# Patient Record
Sex: Male | Born: 1990 | Race: White | Hispanic: No | Marital: Single | Smoking: Current every day smoker
Health system: Southern US, Community
[De-identification: ages and names within clinical notes are randomized; demographics above are authoritative.]

## PROBLEM LIST (undated history)

## (undated) DIAGNOSIS — Z87730 Personal history of (corrected) cleft lip and palate: Secondary | ICD-10-CM

## (undated) HISTORY — PX: CLEFT PALATE REPAIR: SUR1165

## (undated) HISTORY — PX: CLEFT LIP REPAIR: SUR1164

---
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
## 2011-01-29 NOTE — Consult Note (Signed)
[place]  Patient:    [name], [name]                    MRN: [id number] Proc. Date: 12/14/99 Attending:  Janet Berlin. Dan Humphreys, M.D. CC:         Dora Sims, M.D.                          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  These defects have gone on to be repaired.  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  He has done very well.  He was last seen n a clinic here one year ago.  PLASTIC SURGERY:  The patient is noted to be doing very well overall.  He still  continues to have some slump of the left nasal ala; there is also a small ______ deformity on the left upper lip.  His pharyngeal flap is noted to be intact. He has a palatal expander in place.  There is a fairly large alveolar gap.  SPEECH PATHOLOGY:  The mother reports slightly increase in hypernasality with the palatal expander; however, the speech is found to be completely intelligible and oronasal resonance is felt to be within normal limits.  No recommendations were  made.  ORAL SURGERY:  The patients arch is noted to be fairly stable.  There does seem to be a little bit of persistent narrowing transversely.  The patient is felt to need some radiographs in order to more accurately gauge the development of the teeth of the arch in the immediate vicinity of the cleft; this will be set up through the office of Dr. Dora Sims.  A final decision as to timing of the bone graft  will not be made until more accurate evaluation is completed in regards to his ony development.  FINAL RECOMMENDATIONS:  Most of the patients issues at this point will revolve around management of his alveolar cleft.  Since he has no other pressing issues  currently, and is not anticipated to have any, given how well he is doing, we will discharge him from  further followup in the [facility].  He will be seen by appropriate specialists as the need arises.  In late adolescence, he should be evaluated by plastic surgery in order to determine the timing of any intervention in regards to lip repair as well as repair of his nasal deformity.  These issues will probably be best put off until he reaches skeletal maturity. DD:  12/14/99 TD:  12/14/99 Job: [id number]

## 2011-01-29 NOTE — Op Note (Signed)
NAME:  Jesse Howard, Jesse Howard                        ACCOUNT NO.:  [id number]   MEDICAL RECORD NO.:  [id number]                   PATIENT TYPE:  AMB   LOCATION:  DSC                                  FACILITY:  [facility]   PHYSICIAN:  Dora Sims, M.D.               DATE OF BIRTH:  [birth year]   DATE OF PROCEDURE:  08/27/2002  DATE OF DISCHARGE:                                 OPERATIVE REPORT   PREOPERATIVE DIAGNOSIS:  Left alveolar cleft palate.   POSTOPERATIVE DIAGNOSIS:  Left alveolar cleft palate.   OPERATION PERFORMED:  Harvest of left anterior iliac crest bone graft for  placement to left alveolar cleft.   SURGEON:  Dora Sims, M.D.   ASSISTANT:  Loni Beckwith   ANESTHESIA:  General endotracheal.   INDICATIONS FOR PROCEDURE:  The patient is a 20 year old boy whose  orthodontist is Corliss Parish.  He is in a mixed dentition phase.  He has been  treated for cleft lip and he has a cleft palate defect.  Teeth numbers 9 and  10 are evident radiographically and he is in need of his bone graft prior to  eruption of these teeth.  Appropriate consents were reviewed with the  parents and the patient.  The patient was then consented for surgery.   DESCRIPTION OF PROCEDURE:  The patient was maintained n.p.o. the night  before surgery, brought to the operating room and placed in supine position.  All anesthesia monitors were found to be working appropriately.  The patient  was orotracheally intubated with an oral ray tube with minimal difficulty.  This was confirmed by positive end tidal CO2 as well as bilaterally clear  breath sounds.  Prior to intubation under just gas mash anesthetic, an IV  was started in the left antecubital fossa at which time 60 cc of whole blood  was drawn for centrifuge for use as platelet rich plasma.  The patient was  then appropriately padded and after intubation, the patient's left iliac  crest area was prepped and draped in the normal sterile fashion.  Simultaneously, Dr. Dorcas Carrow was working at the head and the oral cavity  and mouth area was prepped and draped in the normal sterile fashion.  Two  fields were draped apart so as to not cross contaminate the oral field to  the hip field.  An Ioban drape was placed over the left anterior iliac crest  after using a marking pen to demarcate the anterior iliac spine along with  the crest.  There was retraction medially so as to have the incision line be  inferior to the iliac crest at rest.  He was injected with approximately 7  cc of 2% Xylocaine with 1:100,000 parts epinephrine.  After adequate time  for vasoconstriction was allowed, a #15 Bard Parker scalpel was used to make  skin incision down to the subcutaneous layer of fat.  Bovie electrocautery  unit was used to coagulate any small bleeding vessels.  Dissection was  continued down with the Bovie through fascia to the cartilaginous cap of the  anterior iliac crest.  A periosteal elevator was used to demarcate the  anterior and posterior limits of the cartilaginous cap.  A #10 blade was  used to incise through perichondrium down to the cartilaginous osseous  junction.  The horizontal cut was then used to bridge the two vertical  anterior posterior cuts and the cartilaginous cap was elevated and reflected  medially.  A cobra retractor was used after periosteum was elevated to  expose the marrow space of the anterior iliac crest.  Approximately 6 cc of  cancellous bone was harvested from the iliac crest area.  I could easily  look over the drape into the alveolar cleft defect and was deemed adequate  for complete filling of the recipient bed site.  After this was done, it was  placed in some saline and set on ice until time for use up in the oral  cavity.  The wound was irrigated with copious amounts of normal saline  irrigation.  Approximately 2 cc of PRP was placed into the marrow space and  almost instantaneous hemostasis was achieved.   The cartilaginous cap was  replaced into its normal position and was closed using 3-0 Monocryl sutures  in a running fashion and was closed in layers.  The fascia was closed above  this, again with a 3-0 Monocryl and the skin was closed in a subcuticular  fashion using 4-0 Vicryl sutures in a running fashion.  The skin was  cleaned, prepped with a tincture of benzoin prep and half inch Steri-Strips  were placed in order to maintain any wound tension off of the incision.  Fluffs were placed over that and a pressure dressing was placed on top of  the donor site.  Approximately 20 cc of blood was lost.  No blood was  administered.  In addition 60 cc of blood was taken for the use of platelet  rich plasma, nothing was sent for pathology and the bone graft that was  harvested was then brought up to the field of the oral cavity where the  cleft palate revision was being repaired.  I then assisted Dr. Egbert Garibaldi in the  closing of nasal mucosa, placement of a PRP membrane, packing of the bone  and closure of all oral mucosa over the graft site.  This patient will be  maintained here at [facility] as a 23 hour observation  for postanesthesia care as well as pain management and antibiotics.  He will  be discharged in the morning.                                                Dora Sims, M.D.    RJR/MEDQ  D:  08/27/2002  T:  08/27/2002  Job:  [id number]   cc:   Lorin Picket R. Egbert Garibaldi, D.D.S.  [address]  [place]  [place] [address]  Fax: [phone]

## 2011-01-29 NOTE — Op Note (Signed)
NAME:  Jesse Howard, Jesse Howard                        ACCOUNT NO.:  [id number]   MEDICAL RECORD NO.:  [id number]                   PATIENT TYPE:  AMB   LOCATION:  DSC                                  FACILITY:  [facility]   PHYSICIAN:  Scott R. Rehm, D.D.S.               DATE OF BIRTH:  02/17/1991   DATE OF PROCEDURE:  08/27/2002  DATE OF DISCHARGE:                                 OPERATIVE REPORT   PREOPERATIVE DIAGNOSIS:  Left alveolar cleft.   POSTOPERATIVE DIAGNOSIS:  Left alveolar cleft.   PROCEDURE:  1. Iliac crest bone graft from the left side.  2. Use of platelet-rich plasma for grafting to the patient's left alveolus.   SURGEON:  Dora Sims, M.D. and Scott R. Egbert Garibaldi, D.D.S.   COMPLICATIONS:  None.   INDICATIONS FOR PROCEDURE:  The patient is an otherwise healthy 20 year old  Caucasian male who has had a cleft lip and palate with numerous surgeries in  the past.  His orthodontist is Dr. Allen Norris, who has had the patient  prepped for a left alveolar bone graft for some time, with appropriate  orthodontic appliances present.   DESCRIPTION OF PROCEDURE:  The patient was brought to the Day Surgical  Center at [facility] and placed in the supine position,  and appropriate monitors were attached.  General anesthesia was then induced  via sevoflurane inhalational anesthetic, at which point the left antecubital  fossa IV was started and 60 cc of blood was drawn for a platelet-rich plasma  procedure to be used during the surgical procedure.  At this point the left  iliac crest was then prepped in the standard fashion for an iliac crest bone  graft, and appropriate sand bag under his left hip.  The oral cavity was  prepped and draped in the standard fashion after the oral intubation was  done by the anesthesiologist.  A tube was secured in the patient's right  side.  A separate dictation for the left iliac crest will follow by Dr.  Monia Pouch.  At this point the  oral cavity was suctioned.  The nurse had  previously placed the throat pack.  Xylocaine 2% with 1:100,000 epinephrine,  a total of 4 cc was infiltrated to the left alveolus and then left greater  palatine in sites the nerve blocks were given.  Also a buccal infiltration  was given to the maxillary anterior region from canine to canine region.  At  this point it was noted that the patient had an orthodontic appliance  present still, which was cut at the junction of tooth #9, and the permanent  tooth #14 had a bracket on it which was removed.  A full thickness  mucoperiosteal flap was then elevated medial to #14, going down the  alveolus.  There was noted to be premolars that were trying to erupt their  way into the mouth at this point,  initially up to the alveolar cleft  portion.  At this point the new #15 blade was then used to dissect down  towards the cleft site, trying to stay right on top of the bone so that the  nasal mucosa could be reflected superiorly and the buccal mucosa could be  reflected buccally.  Using a periosteal elevator, the distal portion of the  cleft was then reflected to a more anterior region.  At this point the #15  blade was then used to make an incision at the attached and non-attached  gingiva, distal to tooth #8, going mesial to the attached and nonattached  region, where a release was made distally.  A full thickness mucoperiosteal  flap was then elevated going superiorly and once again this incision was  carried down to bone on the alveolar cleft site and the buccal region.  This  was done following the alveolus going towards the nose, at which point a  tenotomy scissors was then used to make a dissection of the soft tissue  going towards the lip region portion, which would be reflected more towards  the nasal mucosa, and a buccal mucosa was created by this dissection.  At  this point an incision was made from tooth #14 distally, and a palatal flap  was then  gently reflected.  This was reflected more anteriorly going to the  alveolar cleft site, at which point a new blade was then used to cut down  towards the cleft, going posteriorly, being careful to stay  at the bone at  this point.  Two periosteal elevators were used to push the tissue medial  and lateral on the bone, so that when the blade was used to cut right on the  bone region.  This was then going posterior to the posterior extent of the  cleft.  At this point a sulcar incision was then done only the patient's  right upper canine lingually, going to the patient's upper incisor central  on the left side.  Some gentle reflection was then done at this point, and  once again a #15 blade was then used to cut down halfway  between the nasal  mucosa and the palatal gingiva, once again using two periosteal elevators to  kind of make the tissue taut and cutting with a #15 blade going posterior to  the posterior extent of the cleft.  It was felt that the cleft was kind of  narrow going posterior in the palate.  Reflection using a subperiosteal  elevator was then done to reflect the nasal mucosa superiorly, at which  point it was noted to be intact.  It was noted to be in very good condition.  At this point a normal saline irrigation followed, and the iliac crest bone  graft had been harvested by this point, and the platelet-rich plasma was  ready for delivery.  It was also noted that tooth #10 was almost ready to  erupt, and a decision was made to leave this tooth at this point, with the  idea that hopefully the bone would attach to that tooth prior to eruption,  rather than sacrifice a tooth in the cleft site.  Anyway, the nasal mucosa  was then closed with #4-0 Vicryl in a direct suturing fashion x10 sutures.  It was noted to be difficult to suture posteriorly, and it was elected at this point to use a platelet-rich plasma membrane.  This was made in a  standard fashion on the under-side  of a  sterile cup.  The platelet-rich  plasma membrane was allowed to set for five to 10 minutes, and was kind of  gel-like.  A periosteal elevator was used to place the nasal mucosa  superiorly, and the platelet-rich plasma was then placed in this region.  The medullary bone from the iliac crest was then mixed with PRP and was  packed densely into the cleft site, going more anteriorly.  This was packed  very densely and then squirts of platelet-rich plasma was then also placed  in this region.  The palatal mucosa was sutured using #4-0 Vicryl x10  sutures, and the buccal mucosa was sutured with #20 Vicryl sutures.  Part of  the way through the suturing process, the rest of the platelet-rich plasma  was then injected into the cleft site, and direct sutures were placed  interdentally in the maxillary anterior region, and suturing was then done  as best as possible over the premolars and the lateral releases distally  were both closed with #4-0 Vicryl.  At this point the area was irrigated and  inspected and found to be completely closed.  The oral cavity was then  suctioned and the throat pack was removed.  The needle, sponge and  instruments counts were all found to be correct.  The estimated blood loss  during the procedure is 150 cc.  The urine output and the fluid replacement  was 500 cc.  The patient was given 500 mg of Ancef prior to the procedure  and 8 mg of Decadron.  The patient was then transferred to the recovery room.  The vital signs were  stable in a sedated state.                                               Scott R. Egbert Garibaldi, D.D.S.    SRR/MEDQ  D:  08/27/2002  T:  08/27/2002  Job:  [id number]   cc:   Dora Sims, M.D.  [address]  [place]  [place] [address]  Fax: [phone]

## 2011-01-29 NOTE — Op Note (Signed)
[name], Jesse Howard              ACCOUNT NO.:  [id number]   MEDICAL RECORD NO.:  [id number]          PATIENT TYPE:  OBV   LOCATION:  2550                         FACILITY:  [facility]   PHYSICIAN:  Prabhakar D. Pendse, M.D.DATE OF BIRTH:  [birth year]   DATE OF PROCEDURE:  10/06/2005  DATE OF DISCHARGE:                                 OPERATIVE REPORT   PREOPERATIVE DIAGNOSES:  1.  Foreign body of the upper esophagus, piece of dental retainer.  2.  Status post multiple surgical procedures for cleft lip, cleft palate and      pharyngeal flap repair.   POSTOPERATIVE DIAGNOSES:  1.  Foreign body of the upper esophagus, piece of dental retainer.  2.  Status post multiple surgical procedures for cleft lip, cleft palate and      pharyngeal flap repair.   OPERATION PERFORMED:  Upper endoscopy and removal of foreign body of the  upper esophagus.   SURGEON:  Prabhakar D. Levie Heritage, M.D.   ASSISTANT:  Nurse.   ANESTHESIA:  Nurse.   OPERATIVE PROCEDURE:  Under satisfactory general endotracheal anesthesia,  the patient in supine position, Q endoscope was gently passed through the  oral cavity, hypopharynx, into the upper esophagus.  Careful inspection  showed the upper esophagus to be free of any injury.  The scope was now  advanced under direct vision.  A foreign body in the form of a piece of  dental retainer was located in the upper esophagus.  By appropriate  manipulation and rat-toothed forceps, the foreign body was grasped and with  manipulation was removed in one attempt.  After removal of these foreign  body, the scope was reintroduced and the entire esophagus was inspected.  There were no lacerations, tears or puncture wounds noted.  The gastric  cavity was aspirated and the scope was withdrawn.  Throughout the procedure  the patient's vital signs remained stable.  The patient withstood the  procedure well and was transferred to recovery room in satisfactory general  condition.           ______________________________  Hyman Bible Levie Heritage, M.D.     PDP/MEDQ  D:  10/06/2005  T:  10/07/2005  Job:  [id number]

## 2012-09-28 ENCOUNTER — Emergency Department: Payer: Self-pay | Admitting: Emergency Medicine

## 2012-09-28 LAB — CBC
MCH: 31.9 pg (ref 26.0–34.0)
MCHC: 35.1 g/dL (ref 32.0–36.0)
Platelet: 376 10*3/uL (ref 150–440)

## 2012-09-28 LAB — URINALYSIS, COMPLETE
Glucose,UR: NEGATIVE mg/dL (ref 0–75)
Ketone: NEGATIVE
RBC,UR: 29 /HPF (ref 0–5)
Specific Gravity: 1.02 (ref 1.003–1.030)
Squamous Epithelial: NONE SEEN

## 2012-09-28 LAB — BASIC METABOLIC PANEL
BUN: 12 mg/dL (ref 7–18)
Calcium, Total: 8.9 mg/dL (ref 8.5–10.1)
Chloride: 107 mmol/L (ref 98–107)
Creatinine: 0.87 mg/dL (ref 0.60–1.30)
EGFR (Non-African Amer.): >60
Sodium: 143 mmol/L (ref 136–145)

## 2012-10-01 LAB — URINE CULTURE

## 2013-04-02 ENCOUNTER — Emergency Department: Payer: Self-pay | Admitting: Emergency Medicine

## 2013-04-05 ENCOUNTER — Emergency Department: Payer: Self-pay | Admitting: Emergency Medicine

## 2013-11-05 ENCOUNTER — Emergency Department: Payer: Self-pay | Admitting: Emergency Medicine

## 2014-08-14 ENCOUNTER — Emergency Department: Payer: Self-pay | Admitting: Emergency Medicine

## 2014-08-15 LAB — URINALYSIS, COMPLETE
BACTERIA: NONE SEEN
BILIRUBIN, UR: NEGATIVE
BLOOD: NEGATIVE
Glucose,UR: NEGATIVE mg/dL (ref 0–75)
KETONE: NEGATIVE
Leukocyte Esterase: NEGATIVE
Nitrite: NEGATIVE
PROTEIN: NEGATIVE
Ph: 7 (ref 4.5–8.0)
SPECIFIC GRAVITY: 1.014 (ref 1.003–1.030)
Squamous Epithelial: NONE SEEN
WBC UR: <1 /HPF (ref 0–5)

## 2014-08-15 LAB — GC/CHLAMYDIA PROBE AMP

## 2015-02-13 ENCOUNTER — Emergency Department
Admission: EM | Admit: 2015-02-13 | Discharge: 2015-02-13 | Disposition: A | Discharge status: Home / Self Care | Payer: Self-pay | Attending: Emergency Medicine | Admitting: Emergency Medicine

## 2015-02-13 DIAGNOSIS — L089 Local infection of the skin and subcutaneous tissue, unspecified: Secondary | ICD-10-CM

## 2015-02-13 DIAGNOSIS — B9689 Other specified bacterial agents as the cause of diseases classified elsewhere: Secondary | ICD-10-CM

## 2015-02-13 DIAGNOSIS — A499 Bacterial infection, unspecified: Secondary | ICD-10-CM | POA: Insufficient documentation

## 2015-02-13 DIAGNOSIS — R590 Localized enlarged lymph nodes: Secondary | ICD-10-CM | POA: Insufficient documentation

## 2015-02-13 MED ORDER — TRAMADOL HCL 50 MG PO TABS
ORAL_TABLET | ORAL | Status: AC
  Filled 2015-02-13: qty 1

## 2015-02-13 MED ORDER — IBUPROFEN 800 MG PO TABS
800.0000 mg | ORAL_TABLET | Freq: Once | ORAL | Status: AC
  Administered 2015-02-13: 800 mg via ORAL

## 2015-02-13 MED ORDER — TRAMADOL HCL 50 MG PO TABS: 50.0000 mg | ORAL_TABLET | Freq: Four times a day (QID) | ORAL | Status: DC | PRN

## 2015-02-13 MED ORDER — IBUPROFEN 800 MG PO TABS: 800.0000 mg | ORAL_TABLET | Freq: Three times a day (TID) | ORAL | Status: DC | PRN

## 2015-02-13 MED ORDER — TRAMADOL HCL 50 MG PO TABS
50.0000 mg | ORAL_TABLET | Freq: Once | ORAL | Status: AC
  Administered 2015-02-13: 50 mg via ORAL

## 2015-02-13 MED ORDER — SULFAMETHOXAZOLE-TRIMETHOPRIM 800-160 MG PO TABS
1.0000 | ORAL_TABLET | Freq: Once | ORAL | Status: AC
  Administered 2015-02-13: 1 via ORAL

## 2015-02-13 MED ORDER — SULFAMETHOXAZOLE-TRIMETHOPRIM 800-160 MG PO TABS: 1.0000 | ORAL_TABLET | Freq: Two times a day (BID) | ORAL | Status: DC

## 2015-02-13 MED ORDER — SULFAMETHOXAZOLE-TRIMETHOPRIM 800-160 MG PO TABS
ORAL_TABLET | ORAL | Status: AC
  Filled 2015-02-13: qty 1

## 2015-02-13 MED ORDER — IBUPROFEN 800 MG PO TABS
ORAL_TABLET | ORAL | Status: AC
  Filled 2015-02-13: qty 1

## 2015-02-13 NOTE — ED Notes (Signed)
Pt informed to return if any life threatening symptoms occur.  

## 2015-02-13 NOTE — ED Provider Notes (Signed)
[facility] Emergency Department Provider Note  ____________________________________________  Time seen: Approximately 10:14 PM  I have reviewed the triage vital signs and the nursing notes.   HISTORY  Chief Complaint Abscess    HPI Jesse Howard is a 24 y.o. male patient came today to ER complaining of a left inguinal nodule and diarrhea swollen area to the left buttocks. Patient denies any fever or chills with this complaint. There is no discharge from the lesion on his buttocks. Patient states he rides a lot more every day cut grass. Patient rated his pain as a 9/10.   No past medical history on file.  There are no active problems to display for this patient.   No past surgical history on file.  Current Outpatient Rx  Name  Route  Sig  Dispense  Refill  . ibuprofen (ADVIL,MOTRIN) 800 MG tablet   Oral   Take 1 tablet (800 mg total) by mouth every 8 (eight) hours as needed for moderate pain.   15 tablet   0   . sulfamethoxazole-trimethoprim (BACTRIM DS,SEPTRA DS) 800-160 MG per tablet   Oral   Take 1 tablet by mouth 2 (two) times daily.   20 tablet   0   . traMADol (ULTRAM) 50 MG tablet   Oral   Take 1 tablet (50 mg total) by mouth every 6 (six) hours as needed for moderate pain.   12 tablet   0     Allergies Shellfish allergy  No family history on file.  Social History History  Substance Use Topics  . Smoking status: Not on file  . Smokeless tobacco: Not on file  . Alcohol Use: Not on file    Review of Systems Constitutional: No fever/chills Eyes: No visual changes. ENT: No sore throat. Cardiovascular: Denies chest pain. Respiratory: Denies shortness of breath. Gastrointestinal: No abdominal pain.  No nausea, no vomiting.  No diarrhea.  No constipation. Genitourinary: Negative for dysuria. Musculoskeletal: Negative for back pain. Skin: Papular lesion left buttocks Neurological: Negative for headaches, focal weakness  or numbness. Hematological/Lymphatic:Left inguinal nodule Allergic/Immunilogical: Shellfish allergy 10-point ROS otherwise negative.  ____________________________________________   PHYSICAL EXAM:  VITAL SIGNS: ED Triage Vitals  Enc Vitals Group     BP 02/13/15 2105 125/85 mmHg     Pulse Rate 02/13/15 2105 89     Resp 02/13/15 2105 18     Temp 02/13/15 2105 98.3 F (36.8 C)     Temp Source 02/13/15 2105 Oral     SpO2 02/13/15 2105 99 %     Weight 02/13/15 2105 155 lb (70.308 kg)     Height 02/13/15 2105  (1.854 m)     Head Cir --      Peak Flow --      Pain Score 02/13/15 2106 9     Pain Loc --      Pain Edu? --      Excl. in GC? --     Constitutional: Alert and oriented. Well appearing and in no acute distress. Afebrile Eyes: Conjunctivae are normal. PERRL. EOMI. Head: Atraumatic. Nose: No congestion/rhinnorhea. Mouth/Throat: Mucous membranes are moist.  Oropharynx non-erythematous. Neck: No stridor.  No deformity free nuchal range of motion nontender palpation.*} Hematological/Lymphatic/Immunilogical: Left inguinal lymphadenopathy. Cardiovascular: Normal rate, regular rhythm. Grossly normal heart sounds.  Good peripheral circulation. Respiratory: Normal respiratory effort.  No retractions. Lungs CTAB. Gastrointestinal: Soft and nontender. No distention. No abdominal bruits. No CVA tenderness. Musculoskeletal: No lower extremity tenderness nor edema.  No joint effusions. Neurologic:  Normal speech and language. No gross focal neurologic deficits are appreciated. Speech is normal. No gait instability. Skin: Papular lesions on erythematous base left buttocks. Area is nonfluctuant without discharge Psychiatric: Mood and affect are normal. Speech and behavior are normal.  ____________________________________________   LABS (all labs ordered are listed, but only abnormal results are displayed)  Labs Reviewed - No data to  display ____________________________________________  EKG  ____________________________________________  RADIOLOGY  ____________________________________________   PROCEDURES  Procedure(s) performed: None  Critical Care performed: No  ____________________________________________   INITIAL IMPRESSION / ASSESSMENT AND PLAN / ED COURSE  Pertinent labs & imaging results that were available during my care of the patient were reviewed by me and considered in my medical decision making (see chart for details). Skin infection left buttocks ____________________________________________   FINAL CLINICAL IMPRESSION(S) / ED DIAGNOSES  Final diagnoses:  Bacterial skin infection  Inguinal adenopathy      Joni ReiningRonald K Smith, PA-C 02/13/15 2220  Richardean Canalavid H Yao, MD 02/13/15 2222

## 2015-02-13 NOTE — ED Provider Notes (Signed)
[facility] Emergency Department Provider Note  ____________________________________________  Time seen: Approximately 10:05 PM  I have reviewed the triage vital signs and the nursing notes.   HISTORY  Chief Complaint Abscess    HPI Jesse Howard is a 24 y.o. male complaining complaining of swelling to the left inguinal area and a swollen area to the left buttocks. Patient denies any fever or chills complaint. States that there is no discharge lesion on his buttocks. He stated he fries along more every day cut grass and feels irritation is causing the pain to his buttocks. Heis rating his pain as a 9/10.   No past medical history on file.  There are no active problems to display for this patient.   No past surgical history on file.  Current Outpatient Rx  Name  Route  Sig  Dispense  Refill  . ibuprofen (ADVIL,MOTRIN) 800 MG tablet   Oral   Take 1 tablet (800 mg total) by mouth every 8 (eight) hours as needed for moderate pain.   15 tablet   0   . sulfamethoxazole-trimethoprim (BACTRIM DS,SEPTRA DS) 800-160 MG per tablet   Oral   Take 1 tablet by mouth 2 (two) times daily.   20 tablet   0   . traMADol (ULTRAM) 50 MG tablet   Oral   Take 1 tablet (50 mg total) by mouth every 6 (six) hours as needed for moderate pain.   12 tablet   0     Allergies Shellfish allergy  No family history on file.  Social History History  Substance Use Topics  . Smoking status: Not on file  . Smokeless tobacco: Not on file  . Alcohol Use: Not on file    Review of Systems Constitutional: No fever/chills Eyes: No visual changes. ENT: No sore throat. Cardiovascular: Denies chest pain. Respiratory: Denies shortness of breath. Gastrointestinal: No abdominal pain.  No nausea, no vomiting.  No diarrhea.  No constipation. Genitourinary: Negative for dysuria. Musculoskeletal: Negative for back pain. Skin: Papular lesion left buttocks Neurological:  Negative for headaches, focal weakness or numbness. Hematological/Lymphatic:Palpable left inguinal nodes 10-point ROS otherwise negative.  ____________________________________________   PHYSICAL EXAM:  VITAL SIGNS: ED Triage Vitals  Enc Vitals Group     BP 02/13/15 2105 125/85 mmHg     Pulse Rate 02/13/15 2105 89     Resp 02/13/15 2105 18     Temp 02/13/15 2105 98.3 F (36.8 C)     Temp Source 02/13/15 2105 Oral     SpO2 02/13/15 2105 99 %     Weight 02/13/15 2105 155 lb (70.308 kg)     Height 02/13/15 2105  (1.854 m)     Head Cir --      Peak Flow --      Pain Score 02/13/15 2106 9     Pain Loc --      Pain Edu? --      Excl. in GC? --    Constitutional: Alert and oriented. Well appearing and in no acute distress. Eyes: Conjunctivae are normal. PERRL. EOMI. Head: Atraumatic. Nose: No congestion/rhinnorhea. Mouth/Throat: Mucous membranes are moist.  Oropharynx non-erythematous. Neck: No stridor.  No deformity for nuchal range of motion nontender palpation. Hematological/Lymphatic/Immunilogical: Left inguinal lymphadenopathy. Cardiovascular: Normal rate, regular rhythm. Grossly normal heart sounds.  Good peripheral circulation. Respiratory: Normal respiratory effort.  No retractions. Lungs CTAB. Gastrointestinal: Soft and nontender. No distention. No abdominal bruits. No CVA tenderness. Musculoskeletal: No lower extremity tenderness nor  edema.  No joint effusions. Neurologic:  Normal speech and language. No gross focal neurologic deficits are appreciated. Speech is normal. No gait instability. Skin:  Papular lesions on erythematous base of the left buttocks without any discharge. Area is nonfluctuant.  Psychiatric: Mood and affect are normal. Speech and behavior are normal.  ____________________________________________   LABS (all labs ordered are listed, but only abnormal results are displayed)  Labs Reviewed - No data to  display ____________________________________________  EKG   ____________________________________________  RADIOLOGY   ____________________________________________   PROCEDURES  Procedure(s) performed: None  Critical Care performed: No  ____________________________________________   INITIAL IMPRESSION / ASSESSMENT AND PLAN / ED COURSE  Pertinent labs & imaging results that were available during my care of the patient were reviewed by me and considered in my medical decision making (see chart for details). Skin infection left buttocks ____________________________________________   FINAL CLINICAL IMPRESSION(S) / ED DIAGNOSES  Final diagnoses:  Bacterial skin infection  Inguinal adenopathy      Joni ReiningRonald K Pervis Macintyre, PA-C 02/16/15 0007  Richardean Canalavid H Yao, MD 02/16/15 [id number]

## 2015-02-13 NOTE — ED Notes (Signed)
Pt was doing heavy work yest and now has swollen are to left groin, no dysuria also co red swollen are to left buttocks.

## 2015-02-13 NOTE — Discharge Instructions (Signed)
Take medications as directed

## 2016-06-09 ENCOUNTER — Emergency Department
Admission: EM | Admit: 2016-06-09 | Discharge: 2016-06-09 | Disposition: A | Discharge status: Home / Self Care | Payer: Self-pay | Attending: Student in an Organized Health Care Education/Training Program | Admitting: Student in an Organized Health Care Education/Training Program

## 2016-06-09 ENCOUNTER — Encounter: Payer: Self-pay | Admitting: Emergency Medicine

## 2016-06-09 ENCOUNTER — Emergency Department: Payer: Self-pay

## 2016-06-09 DIAGNOSIS — F17200 Nicotine dependence, unspecified, uncomplicated: Secondary | ICD-10-CM | POA: Insufficient documentation

## 2016-06-09 DIAGNOSIS — J0100 Acute maxillary sinusitis, unspecified: Secondary | ICD-10-CM | POA: Insufficient documentation

## 2016-06-09 HISTORY — DX: Personal history of (corrected) cleft lip and palate: Z87.730

## 2016-06-09 LAB — CBC WITH DIFFERENTIAL/PLATELET
BASOS ABS: 0 10*3/uL (ref 0–0.1)
Basophils Relative: 0 %
EOS ABS: 0.1 10*3/uL (ref 0–0.7)
EOS PCT: 1 %
HCT: 40.8 % (ref 40.0–52.0)
Hemoglobin: 14.4 g/dL (ref 13.0–18.0)
LYMPHS PCT: 23 %
Lymphs Abs: 1.3 10*3/uL (ref 1.0–3.6)
MCH: 32.2 pg (ref 26.0–34.0)
MCHC: 35.3 g/dL (ref 32.0–36.0)
MCV: 91.2 fL (ref 80.0–100.0)
MONO ABS: 0.5 10*3/uL (ref 0.2–1.0)
Monocytes Relative: 10 %
Neutro Abs: 3.7 10*3/uL (ref 1.4–6.5)
Neutrophils Relative %: 66 %
PLATELETS: 178 10*3/uL (ref 150–440)
RBC: 4.47 MIL/uL (ref 4.40–5.90)
RDW: 12.5 % (ref 11.5–14.5)
WBC: 5.7 10*3/uL (ref 3.8–10.6)

## 2016-06-09 LAB — COMPREHENSIVE METABOLIC PANEL
ALBUMIN: 4.5 g/dL (ref 3.5–5.0)
ALT: 18 U/L (ref 17–63)
AST: 24 U/L (ref 15–41)
Alkaline Phosphatase: 57 U/L (ref 38–126)
Anion gap: 7 (ref 5–15)
BILIRUBIN TOTAL: 0.5 mg/dL (ref 0.3–1.2)
BUN: 10 mg/dL (ref 6–20)
CO2: 28 mmol/L (ref 22–32)
CREATININE: 1.21 mg/dL (ref 0.61–1.24)
Calcium: 9.3 mg/dL (ref 8.9–10.3)
Chloride: 104 mmol/L (ref 101–111)
GFR calc Af Amer: >60 mL/min (ref >60)
GFR calc non Af Amer: >60 mL/min (ref >60)
GLUCOSE: 91 mg/dL (ref 65–99)
Potassium: 3.7 mmol/L (ref 3.5–5.1)
Sodium: 139 mmol/L (ref 135–145)
TOTAL PROTEIN: 7.2 g/dL (ref 6.5–8.1)

## 2016-06-09 MED ORDER — FLUTICASONE PROPIONATE 50 MCG/ACT NA SUSP: 1.0000 | Freq: Two times a day (BID) | NASAL | 0 refills | Status: DC

## 2016-06-09 MED ORDER — IOPAMIDOL (ISOVUE-300) INJECTION 61%
75.0000 mL | Freq: Once | INTRAVENOUS | Status: AC | PRN
  Administered 2016-06-09: 75 mL via INTRAVENOUS
  Filled 2016-06-09: qty 75

## 2016-06-09 MED ORDER — AMOXICILLIN-POT CLAVULANATE 875-125 MG PO TABS: 1.0000 | ORAL_TABLET | Freq: Two times a day (BID) | ORAL | 0 refills | Status: DC

## 2016-06-09 MED ORDER — CETIRIZINE HCL 10 MG PO TABS: 10.0000 mg | ORAL_TABLET | Freq: Every day | ORAL | 0 refills | Status: DC

## 2016-06-09 MED ORDER — MELOXICAM 15 MG PO TABS: 15.0000 mg | ORAL_TABLET | Freq: Every day | ORAL | 0 refills | Status: DC

## 2016-06-09 NOTE — ED Notes (Signed)
See triage note  States he developed pain to right side of face about 3 weeks ago pain radiates from under right eye into jaw area  No redness or swelling noted

## 2016-06-09 NOTE — ED Notes (Signed)
Pt returned from CT ambulatory. Now in restroom.

## 2016-06-09 NOTE — ED Triage Notes (Signed)
Pt reports 3 weeks of right maxillary sinus pain. Has had cleft palate surgeries but denies sinus surgeries. They did have to cut through sinuses for one surgery. Denies fevers.

## 2016-06-09 NOTE — ED Provider Notes (Signed)
[facility] Emergency Department Provider Note  ____________________________________________  Time seen: Approximately 6:30 PM  I have reviewed the triage vital signs and the nursing notes.   HISTORY  Chief Complaint Facial Pain    HPI Jesse Howard is a 25 y.o. male who presents emergency department complaining of severe right maxillary and sinus pain. Patient states that he has had multiple facial surgeries in the past and pain from those have not compared to this. Patient states that the pain starts midline and runs along the maxillary region to his ear. Patient denies any nasal congestion or sneezing. He has tried multiple over-the-counter medications to include allergy medication, sinus medication, Tylenol, Motrin, Aleve. Patient states that this is not improved symptoms. Today he reports that pain he came increasingly worse. Patient denies any headaches, neck pain, fevers or chills, nausea or vomiting.   Past Medical History:  Diagnosis Date  . History of cleft palate with cleft lip     There are no active problems to display for this patient.   Past Surgical History:  Procedure Laterality Date  . CLEFT LIP REPAIR    . CLEFT PALATE REPAIR      Prior to Admission medications   Medication Sig Start Date End Date Taking? Authorizing Provider  amoxicillin-clavulanate (AUGMENTIN) 875-125 MG tablet Take 1 tablet by mouth 2 (two) times daily. 06/09/16   Delorise RoyalsJonathan D Cuthriell, PA-C  cetirizine (ZYRTEC) 10 MG tablet Take 1 tablet (10 mg total) by mouth daily. 06/09/16   Delorise RoyalsJonathan D Cuthriell, PA-C  fluticasone (FLONASE) 50 MCG/ACT nasal spray Place 1 spray into both nostrils 2 (two) times daily. 06/09/16   Delorise RoyalsJonathan D Cuthriell, PA-C  meloxicam (MOBIC) 15 MG tablet Take 1 tablet (15 mg total) by mouth daily. 06/09/16   Delorise RoyalsJonathan D Cuthriell, PA-C    Allergies Shellfish allergy  History reviewed. No pertinent family history.  Social History Social History   Substance Use Topics  . Smoking status: Current Every Day Smoker  . Smokeless tobacco: Not on file  . Alcohol use No     Review of Systems  Constitutional: No fever/chills Eyes: No visual changes. No discharge ENT: No upper respiratory complaints.Positive for severe right maxillary and sinus pain. Cardiovascular: no chest pain. Respiratory: no cough. No SOB. Gastrointestinal: No abdominal pain.  No nausea, no vomiting.  No diarrhea.  No constipation. Musculoskeletal: Negative for musculoskeletal pain. Skin: Negative for rash, abrasions, lacerations, ecchymosis. Neurological: Negative for headaches, focal weakness or numbness. 10-point ROS otherwise negative.  ____________________________________________   PHYSICAL EXAM:  VITAL SIGNS: ED Triage Vitals [06/09/16 1813]  Enc Vitals Group     BP (!) 137/92     Pulse Rate 91     Resp 16     Temp 98.9 F (37.2 C)     Temp Source Oral     SpO2 99 %     Weight 150 lb (68 kg)     Height 6' (1.829 m)     Head Circumference      Peak Flow      Pain Score 9     Pain Loc      Pain Edu?      Excl. in GC?      Constitutional: Alert and oriented. Well appearing and in no acute distress. Eyes: Conjunctivae are normal. PERRL. EOMI. Head: Atraumatic. ENT:      Ears:       Nose: Mild clear congestion/rhinnorhea. Deviated septum to the left. Turbinates are erythematous and  edematous. Patient is extremely tender to percussion over the ethmoid, and maxillary sinuses.      Mouth/Throat: Mucous membranes are moist.  Neck: No stridor. Neck is supple with full range of motion Hematological/Lymphatic/Immunilogical: No cervical lymphadenopathy. Cardiovascular: Normal rate, regular rhythm. Normal S1 and S2.  Good peripheral circulation. Respiratory: Normal respiratory effort without tachypnea or retractions. Lungs CTAB. Good air entry to the bases with no decreased or absent breath sounds. Musculoskeletal: Full range of motion to all  extremities. No gross deformities appreciated. Neurologic:  Normal speech and language. No gross focal neurologic deficits are appreciated.  Skin:  Skin is warm, dry and intact. No rash noted. Psychiatric: Mood and affect are normal. Speech and behavior are normal. Patient exhibits appropriate insight and judgement.   ____________________________________________   LABS (all labs ordered are listed, but only abnormal results are displayed)  Labs Reviewed  COMPREHENSIVE METABOLIC PANEL  CBC WITH DIFFERENTIAL/PLATELET   ____________________________________________  EKG   ____________________________________________  RADIOLOGY Festus Barren Cuthriell, personally viewed and evaluated these images (plain radiographs) as part of my medical decision making, as well as reviewing the written report by the radiologist.  Ct Maxillofacial W Contrast  Result Date: 06/09/2016 CLINICAL DATA:  Subacute onset of right maxillary sinus pain. Initial encounter. EXAM: CT MAXILLOFACIAL WITH CONTRAST TECHNIQUE: Multidetector CT imaging of the maxillofacial structures was performed with intravenous contrast. Multiplanar CT image reconstructions were also generated. A small metallic BB was placed on the right temple in order to reliably differentiate right from left. CONTRAST:  75mL ISOVUE-300 IOPAMIDOL (ISOVUE-300) INJECTION 61% COMPARISON:  CT of the head performed 06/03/2008 FINDINGS: Osseous: There is no evidence of fracture or dislocation. The maxilla and mandible appear intact. The nasal bone is unremarkable in appearance. Multiple maxillary and mandibular dental caries are noted. Orbits: The orbits are intact bilaterally. Sinuses: The patient is status post bilateral maxillary antrectomy, with chronic right-sided mucosal thickening. Underlying postoperative change is noted along the maxillary sinuses bilaterally. The remaining visualized paranasal sinuses and mastoid air cells are well-aerated. No abnormal  focal contrast enhancement is seen. No masses are identified. There is mild soft tissue irregularity at the soft palate, likely reflecting prior surgeries. Soft tissues: No significant soft tissue abnormalities are seen. The parapharyngeal fat planes are preserved. The nasopharynx, oropharynx and hypopharynx are unremarkable in appearance. The visualized portions of the valleculae and piriform sinuses are grossly unremarkable. The parotid and submandibular glands are within normal limits. No cervical lymphadenopathy is seen. Limited intracranial: The visualized portions of the brain are unremarkable. IMPRESSION: 1. No abnormal focal contrast enhancement seen. No masses identified. Mild soft tissue irregularity at the soft palate likely reflects prior surgeries. 2. Status post bilateral maxillary antrectomy, with chronic right-sided mucosal thickening. 3. Multiple maxillary and mandibular dental caries noted. Electronically Signed   By: Roanna Raider M.D.   On: 06/09/2016 19:46    ____________________________________________    PROCEDURES  Procedure(s) performed:    Procedures    Medications  iopamidol (ISOVUE-300) 61 % injection 75 mL (75 mLs Intravenous Contrast Given 06/09/16 1900)     ____________________________________________   INITIAL IMPRESSION / ASSESSMENT AND PLAN / ED COURSE  Pertinent labs & imaging results that were available during my care of the patient were reviewed by me and considered in my medical decision making (see chart for details).  Review of the [place] CSRS was performed in accordance of the [facility] prior to dispensing any controlled drugs.  Clinical Course    Patient's diagnosis is consistent  with Maxillary sinusitis. Patient reported severe pain to right maxillary sinuses. With history of multiple surgeries, imaging was undertaken to look for other possible abnormalities. CT was assaulted with no other acute abnormality.. Patient will be discharged home with  prescriptions for Antibiotics, Flonase, Zyrtec, anti-inflammatories. Patient is to follow up with primary care as needed or otherwise directed. Patient is given ED precautions to return to the ED for any worsening or new symptoms.     ____________________________________________  FINAL CLINICAL IMPRESSION(S) / ED DIAGNOSES  Final diagnoses:  Acute maxillary sinusitis, recurrence not specified      NEW MEDICATIONS STARTED DURING THIS VISIT:  New Prescriptions   AMOXICILLIN-CLAVULANATE (AUGMENTIN) 875-125 MG TABLET    Take 1 tablet by mouth 2 (two) times daily.   CETIRIZINE (ZYRTEC) 10 MG TABLET    Take 1 tablet (10 mg total) by mouth daily.   FLUTICASONE (FLONASE) 50 MCG/ACT NASAL SPRAY    Place 1 spray into both nostrils 2 (two) times daily.   MELOXICAM (MOBIC) 15 MG TABLET    Take 1 tablet (15 mg total) by mouth daily.        This chart was dictated using voice recognition software/Dragon. Despite best efforts to proofread, errors can occur which can change the meaning. Any change was purely unintentional.    Racheal Patches, PA-C 06/09/16 2027    Willy Eddy, MD 06/10/16 [phone]

## 2018-04-12 IMAGING — CT CT MAXILLOFACIAL W/ CM
3 of 4 series · 14 of 47 positions shown, 17 images · IV contrast (iopamidol)
Comparison: CT of the head performed 06/03/2008

CLINICAL DATA: Subacute onset of right maxillary sinus pain.
Initial encounter.

EXAM:
CT MAXILLOFACIAL WITH CONTRAST
TECHNIQUE: Multidetector CT imaging of the maxillofacial structures was
performed with intravenous contrast. Multiplanar CT image
reconstructions were also generated. A small metallic BB was placed
on the right temple in order to reliably differentiate right from
left.
CONTRAST:  75mL 736V72-[id number] IOPAMIDOL ([id number]) INJECTION 61%

[Series 2: max soft · axial · 0.38mm/px · z∈[-165,-1]mm · 9 of 96 slices shown, 12 images]
[im 7/96  brain]
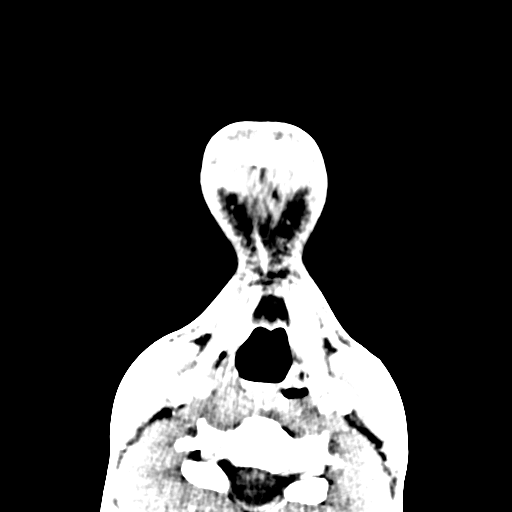
[im 7/96  bone]
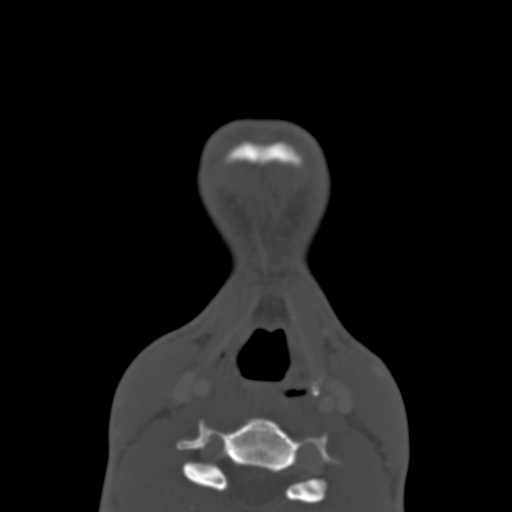
[im 17/96  bone]
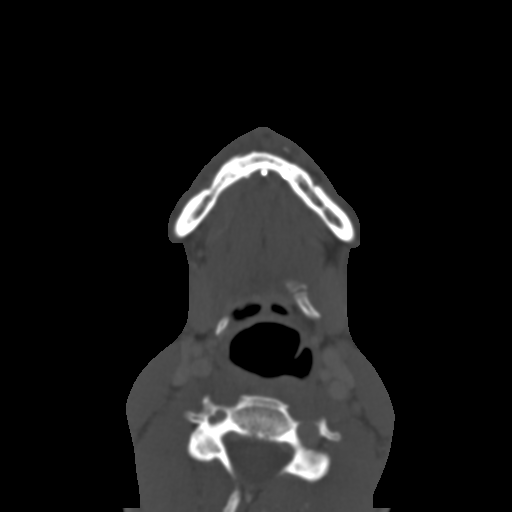
[im 27/96  bone]
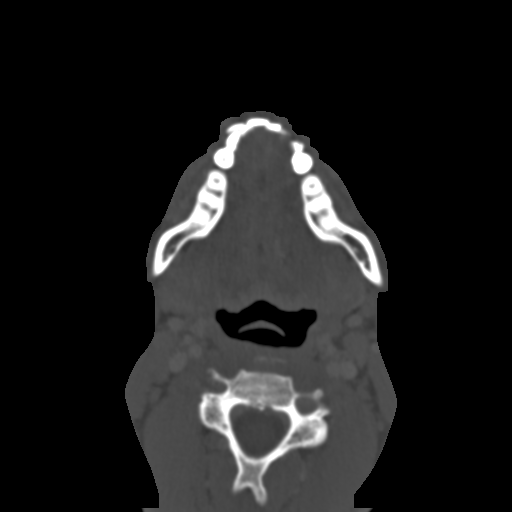
[im 37/96  bone]
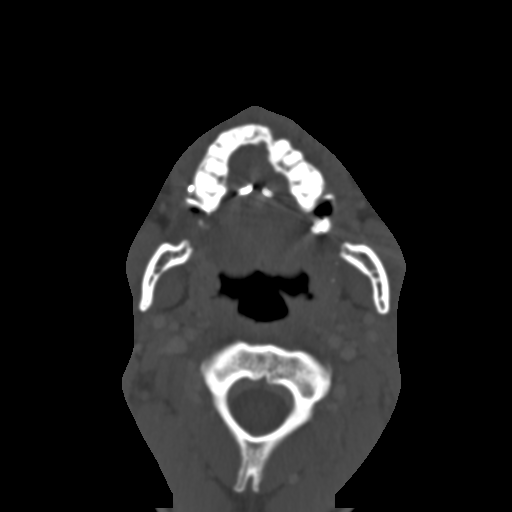
[im 50/96  brain]
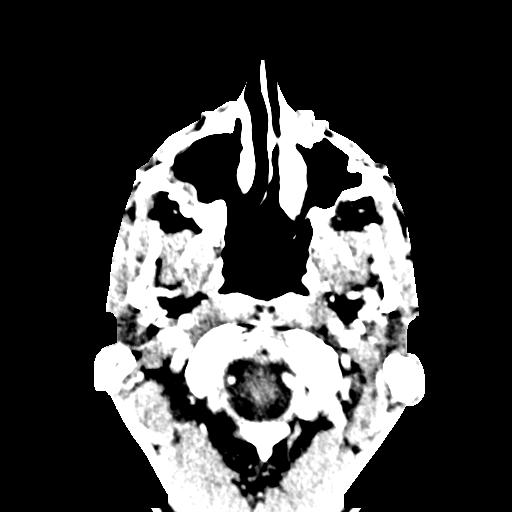
[im 50/96  bone]
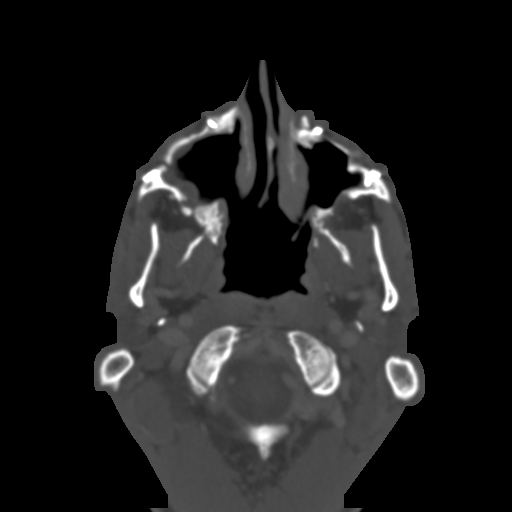
[im 59/96  bone]
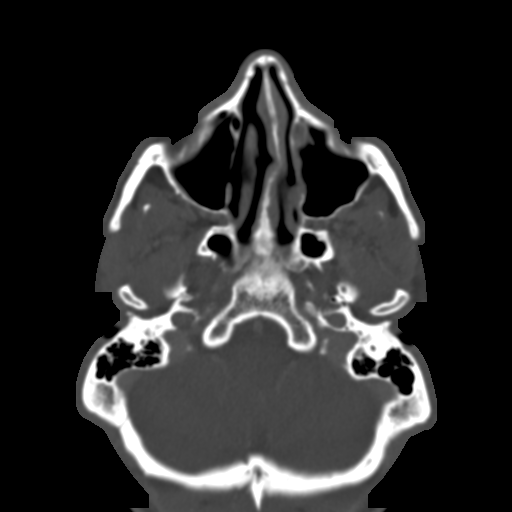
[im 69/96  bone]
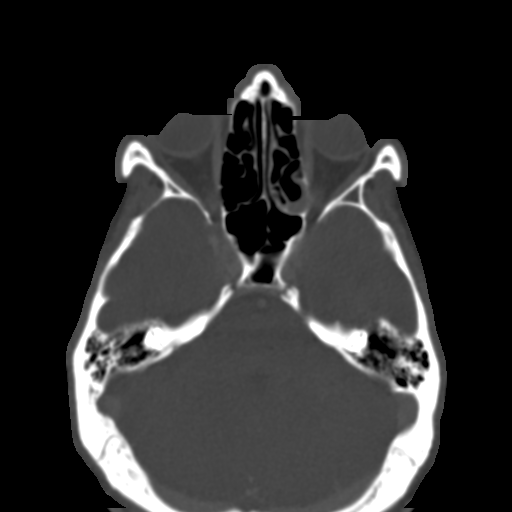
[im 79/96  bone]
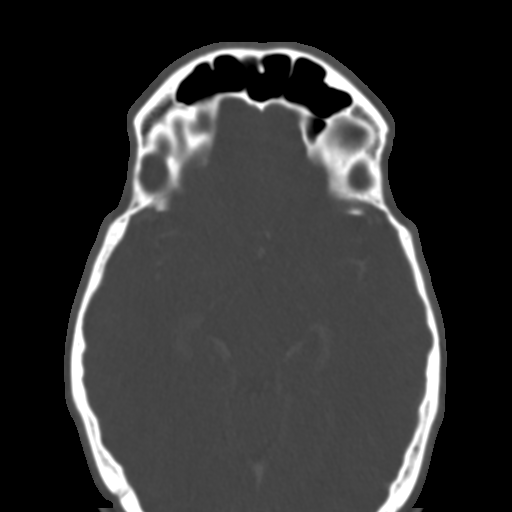
[im 89/96  brain]
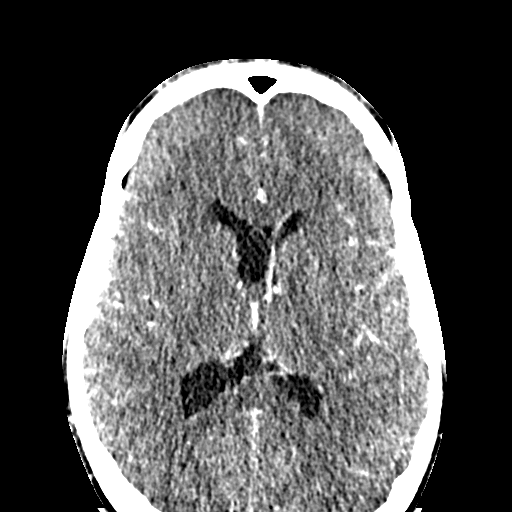
[im 89/96  bone]
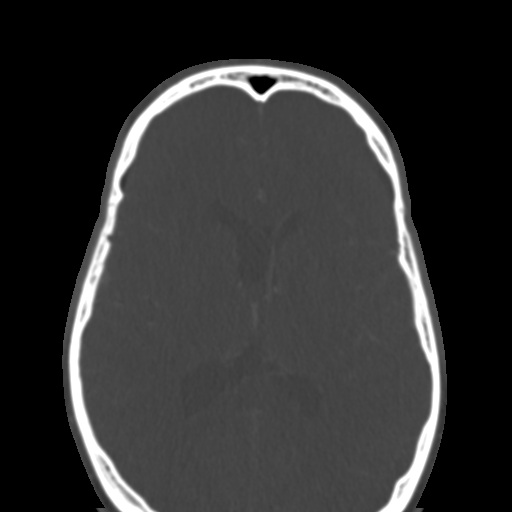

[Series 4: coronal soft · coronal · 0.35mm/px · 3 of 75 slices shown]
[im 25/75  bone]
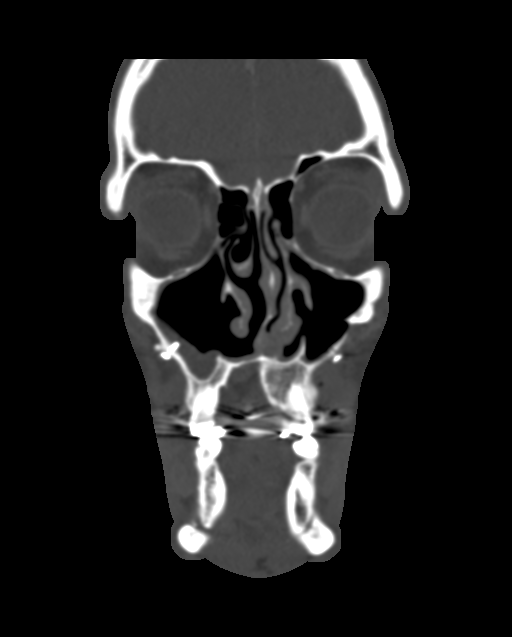
[im 33/75  bone]
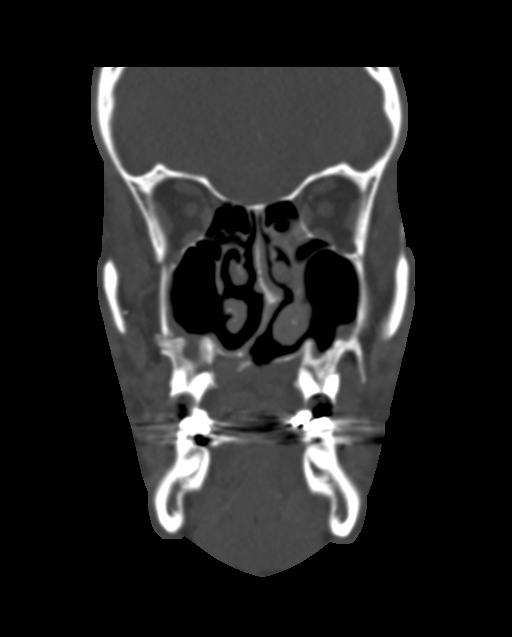
[im 42/75  bone]
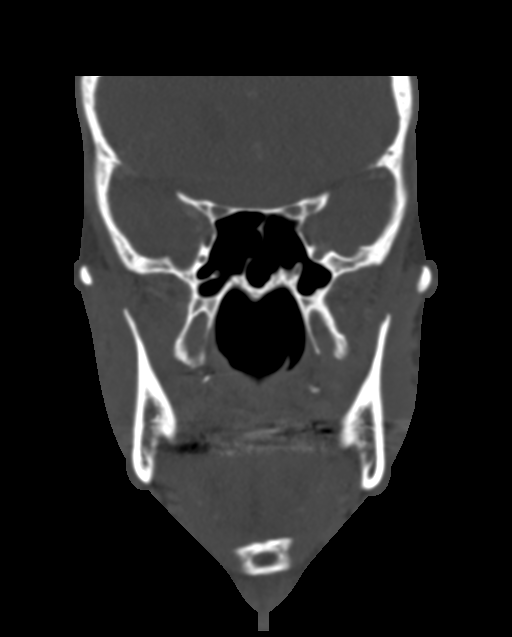

[Series 7: sagittal bone · sagittal · 0.36mm/px · 2 of 74 slices shown]
[im 25/74  bone]
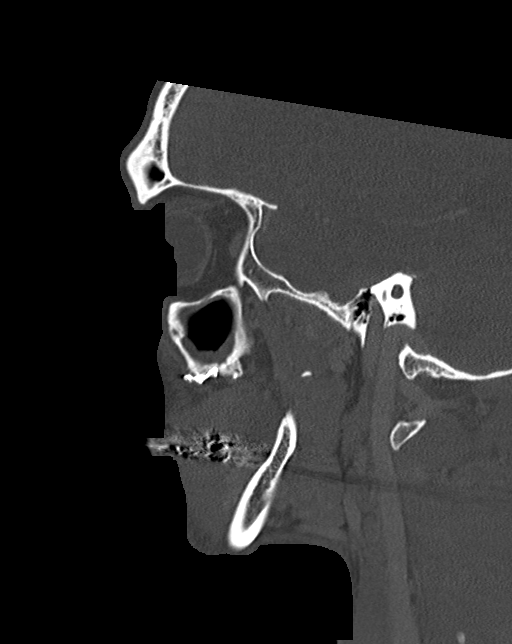
[im 49/74  bone]
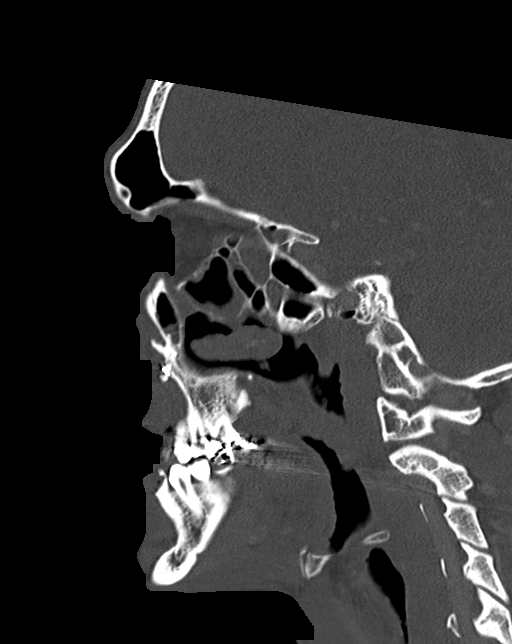

[14 of 47 positions shown; findings below may reference images not displayed]

FINDINGS: Osseous: There is no evidence of fracture or dislocation. The
maxilla and mandible appear intact. The nasal bone is unremarkable
in appearance. Multiple maxillary and mandibular dental caries are
noted.

Orbits: The orbits are intact bilaterally.

Sinuses: The patient is status post bilateral maxillary antrectomy,
with chronic right-sided mucosal thickening. Underlying
postoperative change is noted along the maxillary sinuses
bilaterally. The remaining visualized paranasal sinuses and mastoid
air cells are well-aerated.

No abnormal focal contrast enhancement is seen. No masses are
identified. There is mild soft tissue irregularity at the soft
palate, likely reflecting prior surgeries.

Soft tissues: No significant soft tissue abnormalities are seen. The
parapharyngeal fat planes are preserved. The nasopharynx, oropharynx
and hypopharynx are unremarkable in appearance. The visualized
portions of the valleculae and piriform sinuses are grossly
unremarkable.

The parotid and submandibular glands are within normal limits. No
cervical lymphadenopathy is seen.

Limited intracranial: The visualized portions of the brain are
unremarkable.
IMPRESSION: 1. No abnormal focal contrast enhancement seen. No masses
identified. Mild soft tissue irregularity at the soft palate likely
reflects prior surgeries.
2. Status post bilateral maxillary antrectomy, with chronic
right-sided mucosal thickening.
3. Multiple maxillary and mandibular dental caries noted.

## 2018-11-14 ENCOUNTER — Emergency Department
Admission: EM | Admit: 2018-11-14 | Discharge: 2018-11-14 | Disposition: A | Discharge status: Home / Self Care | Payer: Self-pay | Attending: Emergency Medicine | Admitting: Emergency Medicine

## 2018-11-14 ENCOUNTER — Other Ambulatory Visit: Payer: Self-pay

## 2018-11-14 ENCOUNTER — Encounter: Payer: Self-pay | Admitting: Emergency Medicine

## 2018-11-14 DIAGNOSIS — F17200 Nicotine dependence, unspecified, uncomplicated: Secondary | ICD-10-CM | POA: Insufficient documentation

## 2018-11-14 DIAGNOSIS — K0510 Chronic gingivitis, plaque induced: Secondary | ICD-10-CM | POA: Insufficient documentation

## 2018-11-14 DIAGNOSIS — K047 Periapical abscess without sinus: Secondary | ICD-10-CM | POA: Insufficient documentation

## 2018-11-14 MED ORDER — AMOXICILLIN 500 MG PO CAPS: 500.0000 mg | ORAL_CAPSULE | Freq: Three times a day (TID) | ORAL | 0 refills | Status: DC

## 2018-11-14 MED ORDER — CEFTRIAXONE SODIUM 1 G IJ SOLR
1.0000 g | Freq: Once | INTRAMUSCULAR | Status: AC
  Administered 2018-11-14: 1 g via INTRAMUSCULAR
  Filled 2018-11-14: qty 10

## 2018-11-14 MED ORDER — METRONIDAZOLE 500 MG PO TABS: 500.0000 mg | ORAL_TABLET | Freq: Three times a day (TID) | ORAL | 0 refills | Status: DC

## 2018-11-14 NOTE — ED Triage Notes (Signed)
Says facial swelling left side since yesterdat,  Says he does havea cracked tooth.

## 2018-11-14 NOTE — Discharge Instructions (Signed)
OPTIONS FOR DENTAL FOLLOW UP CARE ° °[place] [facility] - Local Safety Net Dental Clinics °http://www.ncdhhs.gov/dph/oralhealth/services/safetynetclinics.htm °  °[facility] (336-562-3123) ° °[facility] [place] ([phone]) ° °[facility] ([phone] ext [phone]) ° °[facility] ([phone]) ° °[facility] ([phone]) °This clinic caters to the indigent population and is on a lottery system. °Location: °[facility], [address], [place] °Clinic Hours: °Wednesdays from 6pm - 9pm, patients seen by a lottery system. °For dates, call or go to [email or web address].[facility].edu/[facility]/patients/Dental-[facility] °Services: °Cleanings, fillings and simple extractions. °Payment Options: °DENTAL WORK IS FREE OF CHARGE. Bring proof of income or support. °Best way to get seen: °Arrive at 5:15 pm - this is a lottery, NOT first come/first serve, so arriving earlier will not increase your chances of being seen. °  °  °[facility] °[phone] °Select option 1 for emergencies °  °Location: °[facility], [address], [place] °Clinic Hours: °No walk-ins accepted - call the day before to schedule an appointment. °Check in times are 9:30 am and 1:30 pm. °Services: °Simple extractions, temporary fillings, pulpectomy/pulp debridement, uncomplicated abscess drainage. °Payment Options: °PAYMENT IS DUE AT THE TIME OF SERVICE.  Fee is usually $100-200, additional surgical procedures (e.g. abscess drainage) may be extra. °Cash, checks, Visa/MasterCard accepted.  Can file Medicaid if patient is covered for dental - patient should call case worker to check. °No discount for [facility] patients. °Best way to get seen: °MUST call the day before and get onto the schedule. Can usually be seen the next 1-2 days. No walk-ins accepted. °  °  °[facility] °[phone] °   °Location: °[facility], [address], [place] °Clinic Hours: °M, W, Th, F 8am or 1:30pm, Tues 9a or 1:30 - first come/first served. °Services: °Simple extractions, temporary fillings, uncomplicated abscess drainage.  You do not need to be an [place] resident. °Payment Options: °PAYMENT IS DUE AT THE TIME OF SERVICE. °Dental insurance, otherwise sliding scale - bring proof of income or support. °Depending on income and treatment needed, cost is usually $50-200. °Best way to get seen: °Arrive early as it is first come/first served. °  °  °[facility] °[phone] °  °Location: °[address] °Clinic Hours: °Mon-Thu 8a-5p °Services: °Most basic dental services including extractions and fillings. °Payment Options: °PAYMENT IS DUE AT THE TIME OF SERVICE. °Sliding scale, up to 50% off - bring proof if income or support. °Medicaid with dental option accepted. °Best way to get seen: °Call to schedule an appointment, can usually be seen within 2 weeks OR they will try to see walk-ins - show up at 8a or 2p (you may have to wait). °  °  °[facility] °919-245-2435 °[place] RESIDENTS ONLY °  °Location: °[facility], [address], [place], [place] [address] °Clinic Hours: By appointment only. °Monday - Thursday 8am-5pm, Friday 8am-12pm °Services: Cleanings, fillings, extractions. °Payment Options: °PAYMENT IS DUE AT THE TIME OF SERVICE. °Cash, Visa or MasterCard. Sliding scale - $30 minimum per service. °Best way to get seen: °Come in to office, complete packet and make an appointment - need proof of income °or support monies for each household member and proof of [place] residence. °Usually takes about a month to get in. °  °  °[facility] °[phone] °  °Location: °[address]   [place] °Clinic Hours: Walk-in Urgent Care Dental Services are offered Monday-Friday  mornings only. °The numbers of emergencies accepted daily is limited to the number of °providers available. °Maximum 15 - Mondays, Wednesdays & Thursdays °Maximum 10 - Tuesdays & Fridays °Services: °You do not need to be a [place] resident to be seen for a dental emergency. °Emergencies are defined as pain, swelling, abnormal bleeding, or dental trauma. Walkins will receive x-rays if needed. °NOTE: Dental cleaning is not an emergency. °Payment Options: °PAYMENT IS DUE AT THE TIME OF SERVICE. °Minimum co-pay is $40.00 for uninsured patients. °Minimum co-pay is $3.00 for Medicaid with dental coverage. °Dental Insurance is accepted and must be presented at time of visit. °Medicare does not cover dental. °Forms of payment: Cash, credit card, checks. °Best way to get seen: °If not previously registered with the clinic, walk-in dental registration begins at 7:15 am and is on a first come/first serve basis. °If previously registered with the clinic, call to make an appointment. °  °  °[facility] °[phone] °[place] RESIDENTS ONLY °  °Location: °[address], [place],  °Clinic Hours: °Mon-Thu 10a-2p °Services: Extractions only! °Payment Options: °FREE (donations accepted) - bring proof of income or support °Best way to get seen: °Call and schedule an appointment OR come at 8am on the 1st Monday of every month (except for holidays) when it is first come/first served. °  °  °[facility] °[phone] °  °Location: °[address], [place] °Clinic Hours: °Friday mornings °Services, Payment Options, Best way to get seen: °Call for info °

## 2018-11-14 NOTE — ED Notes (Signed)
States he noticed a swollen area to left jaw area couple of days   Now having swelling not entire left side of face  dnies any dental pain but does have some bad teeth

## 2018-11-14 NOTE — ED Provider Notes (Signed)
[facility] Emergency Department Provider Note  ____________________________________________   First MD Initiated Contact with Patient 11/14/18 1433     (approximate)  I have reviewed the triage vital signs and the nursing notes.   HISTORY  Chief Complaint Facial Pain    HPI Jesse Howard is a 28 y.o. male presents emergency department complaining of left-sided jaw swelling.  States symptoms started about 2 days ago.  He does not admit to any dental pain but states he does have some bad teeth and has not seen a dentist in 2 years.  He also complains of a cracked tooth.  He denies any fever chills at this time.    Past Medical History:  Diagnosis Date  . History of cleft palate with cleft lip     There are no active problems to display for this patient.   Past Surgical History:  Procedure Laterality Date  . CLEFT LIP REPAIR    . CLEFT PALATE REPAIR      Prior to Admission medications   Medication Sig Start Date End Date Taking? Authorizing Provider  amoxicillin (AMOXIL) 500 MG capsule Take 1 capsule (500 mg total) by mouth 3 (three) times daily. 11/14/18   Fisher, Roselyn Bering, PA-C  metroNIDAZOLE (FLAGYL) 500 MG tablet Take 1 tablet (500 mg total) by mouth 3 (three) times daily. 11/14/18   Faythe Ghee, PA-C    Allergies Shellfish allergy  No family history on file.  Social History Social History   Tobacco Use  . Smoking status: Current Every Day Smoker  . Smokeless tobacco: Former Engineer, water Use Topics  . Alcohol use: No  . Drug use: No    Review of Systems  Constitutional: No fever/chills Eyes: No visual changes. ENT: No sore throat.  Positive dental pain and facial swelling Respiratory: Denies cough Genitourinary: Negative for dysuria. Musculoskeletal: Negative for back pain. Skin: Negative for rash.    ____________________________________________   PHYSICAL EXAM:  VITAL SIGNS: ED Triage Vitals  Enc Vitals  Group     BP 11/14/18 1417 134/71     Pulse Rate 11/14/18 1417 100     Resp 11/14/18 1417 18     Temp 11/14/18 1417 98.3 F (36.8 C)     Temp Source 11/14/18 1417 Oral     SpO2 11/14/18 1417 99 %     Weight 11/14/18 1400 150 lb (68 kg)     Height 11/14/18 1400 6\' 1"  (1.854 m)     Head Circumference --      Peak Flow --      Pain Score 11/14/18 1400 9     Pain Loc --      Pain Edu? --      Excl. in GC? --     Constitutional: Alert and oriented. Well appearing and in no acute distress. Eyes: Conjunctivae are normal.  Head: Atraumatic. Nose: No congestion/rhinnorhea. Mouth/Throat: Mucous membranes are moist.  Severe dental disease is noted.  Several cavities, a large amount of plaque, and gum disease is also noted.  There is a large amount of swelling noted on the left lower side. Neck:  supple no lymphadenopathy noted Cardiovascular: Normal rate, regular rhythm. Heart sounds are normal Respiratory: Normal respiratory effort.  No retractions, lungs c t a  GU: deferred Musculoskeletal: FROM all extremities, warm and well perfused Neurologic:  Normal speech and language.  Skin:  Skin is warm, dry and intact. No rash noted. Psychiatric: Mood and affect are normal. Speech  and behavior are normal.  ____________________________________________   LABS (all labs ordered are listed, but only abnormal results are displayed)  Labs Reviewed - No data to display ____________________________________________   ____________________________________________  RADIOLOGY    ____________________________________________   PROCEDURES  Procedure(s) performed: Rocephin 1 g IM   Procedures    ____________________________________________   INITIAL IMPRESSION / ASSESSMENT AND PLAN / ED COURSE  Pertinent labs & imaging results that were available during my care of the patient were reviewed by me and considered in my medical decision making (see chart for details).   Patient is a  28 year old male presents emergency department with dental pain and abscess.  Physical exam shows that patient has severe dental disease.  Gum swelling and deterioration.  Concerns for trench mouth were expressed to the patient.  Patient was given an injection of Rocephin 1 g IM while here in the ED.  He was given a prescription for amoxicillin Flagyl as per up-to-date to treat for trench mouth lower gingivitis.  He states he understands all of our instructions and will comply with her treatment plan.  He was given a list of several dental clinics in the area.  It was discharged in stable condition.     As part of my medical decision making, I reviewed the following data within the electronic MEDICAL RECORD NUMBER History obtained from family, Old chart reviewed, Notes from prior ED visits and [place] Controlled Substance Database  ____________________________________________   FINAL CLINICAL IMPRESSION(S) / ED DIAGNOSES  Final diagnoses:  Dental abscess  Gingivitis      NEW MEDICATIONS STARTED DURING THIS VISIT:  Discharge Medication List as of 11/14/2018  3:03 PM    START taking these medications   Details  amoxicillin (AMOXIL) 500 MG capsule Take 1 capsule (500 mg total) by mouth 3 (three) times daily., Starting Tue 11/14/2018, Normal    metroNIDAZOLE (FLAGYL) 500 MG tablet Take 1 tablet (500 mg total) by mouth 3 (three) times daily., Starting Tue 11/14/2018, Normal         Note:  This document was prepared using Dragon voice recognition software and may include unintentional dictation errors.    Faythe Ghee, PA-C 11/14/18 Benjamine Sprague, MD 11/14/18 2052

## 2019-05-02 ENCOUNTER — Emergency Department
Admission: EM | Admit: 2019-05-02 | Discharge: 2019-05-02 | Disposition: A | Discharge status: Home / Self Care | Payer: Self-pay | Attending: Emergency Medicine | Admitting: Emergency Medicine

## 2019-05-02 ENCOUNTER — Other Ambulatory Visit: Payer: Self-pay

## 2019-05-02 DIAGNOSIS — Y999 Unspecified external cause status: Secondary | ICD-10-CM | POA: Insufficient documentation

## 2019-05-02 DIAGNOSIS — X58XXXA Exposure to other specified factors, initial encounter: Secondary | ICD-10-CM | POA: Insufficient documentation

## 2019-05-02 DIAGNOSIS — Y939 Activity, unspecified: Secondary | ICD-10-CM | POA: Insufficient documentation

## 2019-05-02 DIAGNOSIS — K029 Dental caries, unspecified: Secondary | ICD-10-CM

## 2019-05-02 DIAGNOSIS — S025XXA Fracture of tooth (traumatic), initial encounter for closed fracture: Secondary | ICD-10-CM

## 2019-05-02 DIAGNOSIS — K047 Periapical abscess without sinus: Secondary | ICD-10-CM

## 2019-05-02 DIAGNOSIS — Y929 Unspecified place or not applicable: Secondary | ICD-10-CM | POA: Insufficient documentation

## 2019-05-02 DIAGNOSIS — F17210 Nicotine dependence, cigarettes, uncomplicated: Secondary | ICD-10-CM | POA: Insufficient documentation

## 2019-05-02 MED ORDER — METRONIDAZOLE 500 MG PO TABS: 500.0000 mg | ORAL_TABLET | Freq: Three times a day (TID) | ORAL | 0 refills | Status: AC

## 2019-05-02 MED ORDER — AMOXICILLIN 500 MG PO CAPS: 500.0000 mg | ORAL_CAPSULE | Freq: Three times a day (TID) | ORAL | 0 refills | Status: DC

## 2019-05-02 MED ORDER — CEFTRIAXONE SODIUM 1 G IJ SOLR
1.0000 g | Freq: Once | INTRAMUSCULAR | Status: AC
  Administered 2019-05-02: 1 g via INTRAMUSCULAR
  Filled 2019-05-02: qty 10

## 2019-05-02 MED ORDER — LIDOCAINE HCL (PF) 1 % IJ SOLN
5.0000 mL | Freq: Once | INTRAMUSCULAR | Status: AC
  Administered 2019-05-02: 08:00:00 5 mL
  Filled 2019-05-02: qty 5

## 2019-05-02 MED ORDER — IBUPROFEN 600 MG PO TABS: 600.0000 mg | ORAL_TABLET | Freq: Four times a day (QID) | ORAL | 0 refills | Status: AC | PRN

## 2019-05-02 MED ORDER — TRAMADOL HCL 50 MG PO TABS: 50.0000 mg | ORAL_TABLET | Freq: Four times a day (QID) | ORAL | 0 refills | Status: AC | PRN

## 2019-05-02 NOTE — Discharge Instructions (Addendum)
Take medication as directed follow-up for list of dental clinics provided. OPTIONS FOR DENTAL FOLLOW UP CARE  [place] [facility] and [place] [email or web address] [phone])  [facility] [phone])  [place] [phone])  [place] [phone])  [facility] [phone]) This clinic caters to the indigent population and is on a lottery system. Location: [facility], [place], [place], [place] Clinic Hours: Wednesdays from 6pm - 9pm, patients seen by a lottery system. For dates, call or go to [email or web address] Services: Cleanings, fillings and simple extractions. Payment Options: DENTAL WORK IS FREE OF CHARGE. Bring proof of income or support. Best way to get seen: Arrive at 5:15 pm - this is a lottery, NOT first come/first serve, so arriving earlier will not increase your chances of being seen.     [facility] [phone] Select option 1 for emergencies   Location: [facility], [place], [address], [place] Clinic Hours: No walk-ins accepted - call the day before to schedule an appointment. Check in times are 9:30 am and 1:30 pm. Services: Simple extractions, temporary fillings, pulpectomy/pulp debridement, uncomplicated abscess drainage. Payment Options: PAYMENT IS DUE AT THE TIME OF SERVICE.  Fee is usually $100-200, additional surgical procedures (e.g. abscess drainage) may be extra. Cash, checks, Visa/MasterCard accepted.  Can file Medicaid if patient is covered for dental - patient should call case worker to check. No discount for [facility] patients. Best way to get seen: MUST call the day before and get onto the schedule. Can usually be seen the next 1-2 days. No  walk-ins accepted.     [place] [phone]   Location: [place], [place] Clinic Hours: M, W, Th, F 8am or 1:30pm, Tues 9a or 1:30 - first come/first served. Services: Simple extractions, temporary fillings, uncomplicated abscess drainage.  You do not need to be an [facility] resident. Payment Options: PAYMENT IS DUE AT THE TIME OF SERVICE. Dental insurance, otherwise sliding scale - bring proof of income or support. Depending on income and treatment needed, cost is usually $50-200. Best way to get seen: Arrive early as it is first come/first served.     [facility] [phone]   Location: [place] [facility] Hours: Mon-Thu 8a-5p Services: Most basic dental services including extractions and fillings. Payment Options: PAYMENT IS DUE AT THE TIME OF SERVICE. Sliding scale, up to 50% off - bring proof if income or support. Medicaid with dental option accepted. Best way to get seen: Call to schedule an appointment, can usually be seen within 2 weeks OR they will try to see walk-ins - show up at [facility] or 2p (you may have to wait).     [facility] [place] RESIDENTS ONLY   Location: [facility], [place] [address], [place], [place] [address] Clinic Hours: By appointment only. Monday - Thursday 8am-5pm, Friday 8am-12pm Services: Cleanings, fillings, extractions. Payment Options: PAYMENT IS DUE AT THE TIME OF SERVICE. Cash, Visa or MasterCard. Sliding scale - $30 minimum per service. Best way to get seen: Come in to office, complete packet and make an appointment - need proof of income or support monies for each household member and proof of [facility] residence. Usually takes about a month to get in.     [facility]  [facility] [phone]   Location: [address], [facility] Hours:  Walk-in Urgent Care Dental Services are offered Monday-Friday mornings only. The numbers of emergencies accepted daily is limited to the number of providers available. Maximum 15 - Mondays, Wednesdays & Thursdays Maximum 10 - Tuesdays & Fridays Services: You do not need to be a [facility] resident to be seen for a dental emergency. Emergencies are defined as pain, swelling, abnormal bleeding, or dental trauma. Walkins will receive x-rays if needed. NOTE: Dental cleaning is not an emergency. Payment Options: PAYMENT IS DUE AT THE TIME OF SERVICE. Minimum co-pay is $40.00 for uninsured patients. Minimum co-pay is $3.00 for Medicaid with dental coverage. Dental Insurance is accepted and must be presented at time of visit. Medicare does not cover dental. Forms of payment: Cash, credit card, checks. Best way to get seen: If not previously registered with the clinic, walk-in dental registration begins at 7:15 am and is on a first come/first serve basis. If previously registered with the clinic, call to make an appointment.     [facility] [place] ONLY   Location: [address] [place], [place] Clinic Hours: Mon-Thu 10a-2p Services: Extractions only! Payment Options: FREE (donations accepted) - bring proof of income or support Best way to get seen: Call and schedule an appointment OR come at 8am on the 1st Monday of every month (except for holidays) when it is first come/first served.     [facility] [phone]   Location: [place], [place] Clinic Hours: Friday mornings Services, Payment Options, Best way to get seen: Call for info

## 2019-05-02 NOTE — ED Notes (Signed)
See triage note  Presents with left sided dental pain and some jaw swelling    States he thinks he may have an abscess area  Afebrile on arrival

## 2019-05-02 NOTE — ED Provider Notes (Signed)
[facility] Emergency Department Provider Note   ____________________________________________   First MD Initiated Contact with Patient 05/02/19 [phone]     (approximate)  I have reviewed the triage vital signs and the nursing notes.   HISTORY  Chief Complaint Dental Pain    HPI Jesse Howard is a 28 y.o. male patient presents with dental pain and gingiva edema secondary to multiple devitalized teeth.  Patient was seen this facility 5 months ago.  Patient was scheduled to see dentist but appointment was canceled secondary to Covid.  Patient has been rescheduled to be seen for later next month.  Patient rates his pain as a 9/10.  Patient described pain is "achy".  No relief over-the-counter anti-inflammatory medications.         Past Medical History:  Diagnosis Date  . History of cleft palate with cleft lip     There are no active problems to display for this patient.   Past Surgical History:  Procedure Laterality Date  . CLEFT LIP REPAIR    . CLEFT PALATE REPAIR      Prior to Admission medications   Medication Sig Start Date End Date Taking? Authorizing Provider  amoxicillin (AMOXIL) 500 MG capsule Take 1 capsule (500 mg total) by mouth 3 (three) times daily. 05/02/19   Sable Feil, PA-C  ibuprofen (ADVIL) 600 MG tablet Take 1 tablet (600 mg total) by mouth every 6 (six) hours as needed. 05/02/19   Sable Feil, PA-C  metroNIDAZOLE (FLAGYL) 500 MG tablet Take 1 tablet (500 mg total) by mouth 3 (three) times daily for 7 days. 05/02/19 05/09/19  Sable Feil, PA-C  traMADol (ULTRAM) 50 MG tablet Take 1 tablet (50 mg total) by mouth every 6 (six) hours as needed. 05/02/19 05/01/20  Sable Feil, PA-C    Allergies Shellfish allergy  No family history on file.  Social History Social History   Tobacco Use  . Smoking status: Current Every Day Smoker  . Smokeless tobacco: Former Network engineer Use Topics  . Alcohol use: No  . Drug  use: No    Review of Systems  Constitutional: No fever/chills Eyes: No visual changes. ENT: No sore throat.  Dental pain. Cardiovascular: Denies chest pain. Respiratory: Denies shortness of breath. Gastrointestinal: No abdominal pain.  No nausea, no vomiting.  No diarrhea.  No constipation. Genitourinary: Negative for dysuria. Musculoskeletal: Negative for back pain. Skin: Negative for rash. Neurological: Negative for headaches, focal weakness or numbness. Allergic/Immunilogical: Shellfish. ____________________________________________   PHYSICAL EXAM:  VITAL SIGNS: ED Triage Vitals [05/02/19 0719]  Enc Vitals Group     BP 119/72     Pulse Rate (!) 112     Resp 18     Temp 98.5 F (36.9 C)     Temp Source Oral     SpO2 97 %     Weight 160 lb (72.6 kg)     Height 6' (1.829 m)     Head Circumference      Peak Flow      Pain Score 9     Pain Loc      Pain Edu?      Excl. in [place]?    Constitutional: Alert and oriented. Well appearing and in no acute distress. Mouth/Throat: Mucous membranes are moist.  Oropharynx non-erythematous.  Patient has severe dental disease.  Patient has multiple caries and a large amount of plaque.  Mild gingival edema. Neck: No stridor. Hematological/Lymphatic/Immunilogical: No cervical lymphadenopathy. Cardiovascular:  Tachycardic, regular rhythm. Grossly normal heart sounds.  Good peripheral circulation. Respiratory: Normal respiratory effort.  No retractions. Lungs CTAB. Skin:  Skin is warm, dry and intact. No rash noted. Psychiatric: Mood and affect are normal. Speech and behavior are normal.  ____________________________________________   LABS (all labs ordered are listed, but only abnormal results are displayed)  Labs Reviewed - No data to display ____________________________________________  EKG   ____________________________________________  RADIOLOGY  ED MD interpretation:    Official radiology report(s): No results found.   ____________________________________________   PROCEDURES  Procedure(s) performed (including Critical Care):  Procedures   ____________________________________________   INITIAL IMPRESSION / ASSESSMENT AND PLAN / ED COURSE  As part of my medical decision making, I reviewed the following data within the electronic MEDICAL RECORD NUMBER         Jesse Howard was evaluated in Emergency Department on 05/02/2019 for the symptoms described in the history of present illness. He was evaluated in the context of the global COVID-19 pandemic, which necessitated consideration that the patient might be at risk for infection with the SARS-CoV-2 virus that causes COVID-19. Institutional protocols and algorithms that pertain to the evaluation of patients at risk for COVID-19 are in a state of rapid change based on information released by regulatory bodies including the CDC and federal and state organizations. These policies and algorithms were followed during the patient's care in the ED.         ____________________________________________   FINAL CLINICAL IMPRESSION(S) / ED DIAGNOSES  Final diagnoses:  Dental abscess  Infected dental caries  Closed fracture of tooth, initial encounter     ED Discharge Orders         Ordered    amoxicillin (AMOXIL) 500 MG capsule  3 times daily     05/02/19 0741    ibuprofen (ADVIL) 600 MG tablet  Every 6 hours PRN     05/02/19 0741    traMADol (ULTRAM) 50 MG tablet  Every 6 hours PRN     05/02/19 0741    metroNIDAZOLE (FLAGYL) 500 MG tablet  3 times daily     05/02/19 0748           Note:  This document was prepared using Dragon voice recognition software and may include unintentional dictation errors.    Joni ReiningSmith, Ronald K, PA-C 05/02/19 0749    Emily FilbertWilliams, Jonathan E, MD 05/02/19 [phone]

## 2019-05-02 NOTE — ED Triage Notes (Signed)
Pt here with c/o recurring dental pain. NAD.

## 2022-06-17 ENCOUNTER — Encounter: Payer: Self-pay | Admitting: Emergency Medicine

## 2022-06-17 ENCOUNTER — Emergency Department: Payer: Self-pay

## 2022-06-17 ENCOUNTER — Other Ambulatory Visit: Payer: Self-pay

## 2022-06-17 ENCOUNTER — Emergency Department
Admission: EM | Admit: 2022-06-17 | Discharge: 2022-06-17 | Disposition: A | Discharge status: Home / Self Care | Payer: Self-pay | Attending: Emergency Medicine | Admitting: Emergency Medicine

## 2022-06-17 DIAGNOSIS — X500XXA Overexertion from strenuous movement or load, initial encounter: Secondary | ICD-10-CM | POA: Insufficient documentation

## 2022-06-17 DIAGNOSIS — Y990 Civilian activity done for income or pay: Secondary | ICD-10-CM | POA: Insufficient documentation

## 2022-06-17 DIAGNOSIS — S39012A Strain of muscle, fascia and tendon of lower back, initial encounter: Secondary | ICD-10-CM | POA: Insufficient documentation

## 2022-06-17 MED ORDER — METHOCARBAMOL 500 MG PO TABS
1000.0000 mg | ORAL_TABLET | Freq: Once | ORAL | Status: AC
  Administered 2022-06-17: 1000 mg via ORAL
  Filled 2022-06-17: qty 2

## 2022-06-17 MED ORDER — MELOXICAM 15 MG PO TABS: 15.0000 mg | ORAL_TABLET | Freq: Every day | ORAL | 0 refills | Status: AC

## 2022-06-17 MED ORDER — METHOCARBAMOL 500 MG PO TABS: 500.0000 mg | ORAL_TABLET | Freq: Four times a day (QID) | ORAL | 0 refills | Status: AC

## 2022-06-17 MED ORDER — MELOXICAM 7.5 MG PO TABS
15.0000 mg | ORAL_TABLET | Freq: Once | ORAL | Status: AC
  Administered 2022-06-17: 15 mg via ORAL
  Filled 2022-06-17: qty 2

## 2022-06-17 NOTE — ED Provider Notes (Signed)
[facility] Provider Note  Patient Contact: 11:00 PM (approximate)   History   Back Pain   HPI  Jesse Howard is a 31 y.o. male who presents the emergency department complaining of lower back pain.  He has had off-and-on symptoms for a month.  Started after working on a car.  Patient states that he went into work yesterday, was lifting something above his head when he felt his back locked up.  Patient tried to take care of it with over-the-counter medications but when he went back and lifted something roughly 60 pounds today he states that the pain locked his back up again and he presents for evaluation.  No bowel or bladder dysfunction, saddle anesthesia or paresthesias.  No direct trauma to the back.  Patient has no history of ongoing back problems.  No medications prior to arrival.     Physical Exam   Triage Vital Signs: ED Triage Vitals  Enc Vitals Group     BP 06/17/22 2224 115/75     Pulse Rate 06/17/22 2224 81     Resp --      Temp 06/17/22 2224 97.9 F (36.6 C)     Temp Source 06/17/22 2224 Oral     SpO2 06/17/22 2224 94 %     Weight 06/17/22 2223 155 lb (70.3 kg)     Height 06/17/22 2223 6\' 1"  (1.854 m)     Head Circumference --      Peak Flow --      Pain Score 06/17/22 2223 9     Pain Loc --      Pain Edu? --      Excl. in GC? --     Most recent vital signs: Vitals:   06/17/22 2224  BP: 115/75  Pulse: 81  Temp: 97.9 F (36.6 C)  SpO2: 94%     General: Alert and in no acute distress.  Cardiovascular:  Good peripheral perfusion Respiratory: Normal respiratory effort without tachypnea or retractions. Lungs CTAB.  Musculoskeletal: Full range of motion to all extremities.  Visualization of the lumbar spine reveals no visible signs of trauma.  Diffuse tenderness in the bilateral paraspinals worse on the left than right.  No significant midline tenderness and no palpable abnormality or step-off.  Negative straight leg raise  bilaterally.  Dorsalis pedis pulses sensation intact equal bilateral lower extremities. Neurologic:  No gross focal neurologic deficits are appreciated.  Skin:   No rash noted Other:   ED Results / Procedures / Treatments   Labs (all labs ordered are listed, but only abnormal results are displayed) Labs Reviewed - No data to display   EKG     RADIOLOGY  I personally viewed, evaluated, and interpreted these images as part of my medical decision making, as well as reviewing the written report by the radiologist.  ED Provider Interpretation: No acute traumatic findings on lumbar spine x-ray.  DG Lumbar Spine 2-3 Views  Result Date: 06/17/2022 CLINICAL DATA:  Lower back pain for 1 month EXAM: LUMBAR SPINE - 2-3 VIEW COMPARISON:  None Available. FINDINGS: Frontal and lateral views of the lumbar spine are obtained. There are 5 non-rib-bearing lumbar type vertebral bodies in grossly normal anatomic alignment. No acute fractures. Disc spaces are well preserved. Mild facet hypertrophy at L4-5 and L5-S1. Sacroiliac joints are unremarkable. IMPRESSION: 1. Mild lower lumbar facet hypertrophic change. No acute bony abnormality. Electronically Signed   By: 08/17/2022 M.D.   On: 06/17/2022 23:04  PROCEDURES:  Critical Care performed: No  Procedures   MEDICATIONS ORDERED IN ED: Medications  meloxicam (MOBIC) tablet 15 mg (has no administration in time range)  methocarbamol (ROBAXIN) tablet 1,000 mg (has no administration in time range)     IMPRESSION / MDM / ASSESSMENT AND PLAN / ED COURSE  I reviewed the triage vital signs and the nursing notes.                              Differential diagnosis includes, but is not limited to, lumbar strain, herniated disc, sciatica  Patient's presentation is most consistent with acute presentation with potential threat to life or bodily function.   Patient's diagnosis is consistent with lumbar strain.  Patient presented to the emergency  department complaining of lower back pain.  No concerning neuro deficits on physical exam.  Imaging is reassuring with no acute traumatic findings.  Patient will be prescribed symptom control medication of anti-inflammatory muscle relaxer.  First dose administered in the emergency department..  Patient is given ED precautions to return to the ED for any worsening or new symptoms.        FINAL CLINICAL IMPRESSION(S) / ED DIAGNOSES   Final diagnoses:  Strain of lumbar region, initial encounter     Rx / DC Orders   ED Discharge Orders          Ordered    meloxicam (MOBIC) 15 MG tablet  Daily        06/17/22 2313    methocarbamol (ROBAXIN) 500 MG tablet  4 times daily        06/17/22 2313             Note:  This document was prepared using Dragon voice recognition software and may include unintentional dictation errors.   Darletta Moll, PA-C 06/17/22 2314    Harvest Dark, MD 06/27/22 1447

## 2022-06-17 NOTE — ED Triage Notes (Signed)
Pt arrived via POV with c/o low back pain x 1 month, pt states the pain worsened over the past 2 days difficult to lift anything. Denies any injury, pt states occasionally the pain shoots down the L leg when he puts pressure on the leg.  Pt using ibuprofen and heating pad for pain.

## 2024-03-01 ENCOUNTER — Ambulatory Visit
Admission: EM | Admit: 2024-03-01 | Discharge: 2024-03-01 | Disposition: A | Discharge status: Home / Self Care | Attending: Emergency Medicine | Admitting: Emergency Medicine

## 2024-03-01 DIAGNOSIS — K047 Periapical abscess without sinus: Secondary | ICD-10-CM

## 2024-03-01 DIAGNOSIS — K029 Dental caries, unspecified: Secondary | ICD-10-CM

## 2024-03-01 MED ORDER — AMOXICILLIN 875 MG PO TABS: 875.0000 mg | ORAL_TABLET | Freq: Two times a day (BID) | ORAL | 0 refills | Status: AC

## 2024-03-01 NOTE — Discharge Instructions (Addendum)
Take the antibiotic as prescribed.    A dental resource guide is attached.  Please call to make an appointment with a dentist as soon as possible.    Go to the emergency department if you have worsening symptoms.

## 2024-03-01 NOTE — ED Provider Notes (Signed)
 [name]    CSN: [id number] Arrival date & time: 03/01/24  1342      History   Chief Complaint Chief Complaint  Patient presents with   Dental Pain    HPI Jesse Howard is a 33 y.o. male.  Patient presents with right upper tooth ache and swelling.  He has been treating this with Tylenol and penicillin that was prescribed to a friend.  He took half of a penicillin tablet today.  No fever, chills, difficulty swallowing, shortness of breath.  Patient plans to see a dentist next week but has not scheduled an appointment yet.  The history is provided by the patient and medical records.    Past Medical History:  Diagnosis Date   History of cleft palate with cleft lip     There are no active problems to display for this patient.   Past Surgical History:  Procedure Laterality Date   CLEFT LIP REPAIR     CLEFT PALATE REPAIR         Home Medications    Prior to Admission medications   Medication Sig Start Date End Date Taking? Authorizing Provider  amoxicillin  (AMOXIL ) 875 MG tablet Take 1 tablet (875 mg total) by mouth 2 (two) times daily for 10 days. 03/01/24 03/11/24 Yes Wellington Half, NP  ibuprofen  (ADVIL ) 600 MG tablet Take 1 tablet (600 mg total) by mouth every 6 (six) hours as needed. 05/02/19   Marcina Severe, PA-C  methocarbamol  (ROBAXIN ) 500 MG tablet Take 1 tablet (500 mg total) by mouth 4 (four) times daily. 06/17/22   Cuthriell, Ardath Bears, PA-C    Family History History reviewed. No pertinent family history.  Social History Social History   Tobacco Use   Smoking status: Every Day   Smokeless tobacco: Former  Substance Use Topics   Alcohol use: No   Drug use: No     Allergies   Shellfish allergy   Review of Systems Review of Systems  Constitutional:  Negative for chills and fever.  HENT:  Positive for dental problem and facial swelling. Negative for trouble swallowing and voice change.   Respiratory:  Negative for cough and  shortness of breath.      Physical Exam Triage Vital Signs ED Triage Vitals  Encounter Vitals Group     BP 03/01/24 1347 127/81     Girls Systolic BP Percentile --      Girls Diastolic BP Percentile --      Boys Systolic BP Percentile --      Boys Diastolic BP Percentile --      Pulse Rate 03/01/24 1347 (!) 110     Resp 03/01/24 1347 16     Temp 03/01/24 1347 99.7 F (37.6 C)     Temp Source 03/01/24 1347 Oral     SpO2 03/01/24 1347 98 %     Weight --      Height --      Head Circumference --      Peak Flow --      Pain Score 03/01/24 1354 8     Pain Loc --      Pain Education --      Exclude from Growth Chart --    No data found.  Updated Vital Signs BP 127/81 (BP Location: Left Arm)   Pulse (!) 110   Temp 99.7 F (37.6 C) (Oral)   Resp 16   SpO2 98%   Visual Acuity Right Eye Distance:  Left Eye Distance:   Bilateral Distance:    Right Eye Near:   Left Eye Near:    Bilateral Near:     Physical Exam Constitutional:      General: He is not in acute distress. HENT:     Mouth/Throat:     Mouth: Mucous membranes are moist.     Dentition: Abnormal dentition. Dental tenderness and dental caries present.     Comments: Teeth in very poor condition.  Multiple broken and carious teeth.  Right upper gum swelling and tenderness, with facial swelling beside right upper lip.   Cardiovascular:     Rate and Rhythm: Normal rate and regular rhythm.     Heart sounds: Normal heart sounds.  Pulmonary:     Effort: Pulmonary effort is normal. No respiratory distress.     Breath sounds: Normal breath sounds.   Neurological:     Mental Status: He is alert.      UC Treatments / Results  Labs (all labs ordered are listed, but only abnormal results are displayed) Labs Reviewed - No data to display  EKG   Radiology No results found.  Procedures Procedures (including critical care time)  Medications Ordered in UC Medications - No data to display  Initial  Impression / Assessment and Plan / UC Course  I have reviewed the triage vital signs and the nursing notes.  Pertinent labs & imaging results that were available during my care of the patient were reviewed by me and considered in my medical decision making (see chart for details).    Dental abscess, dental pain due to dental caries.  Treating with amoxicillin .  Tylenol or ibuprofen  as needed.  Dental resource guide provided and instructed patient to schedule an appointment with a dentist as soon as possible.  Education also provided on dental abscess.  ED precautions given.  Patient agrees to plan of care.  Final Clinical Impressions(s) / UC Diagnoses   Final diagnoses:  Dental abscess  Pain due to dental caries     Discharge Instructions      Take the antibiotic as prescribed.    A dental resource guide is attached.  Please call to make an appointment with a dentist as soon as possible.    Go to the emergency department if you have worsening symptoms.         ED Prescriptions     Medication Sig Dispense Auth. Provider   amoxicillin  (AMOXIL ) 875 MG tablet Take 1 tablet (875 mg total) by mouth 2 (two) times daily for 10 days. 20 tablet Wellington Half, NP      I have reviewed the PDMP during this encounter.   Wellington Half, NP 03/01/24 [phone]

## 2024-03-01 NOTE — ED Triage Notes (Signed)
 Pt c/o upper dental pain x3 days. Has tried tylenol & neighbors PCN w/o relief.
